# Patient Record
Sex: Male | Born: 2000
Health system: Southern US, Community
[De-identification: ages and names within clinical notes are randomized; demographics above are authoritative.]

## PROBLEM LIST (undated history)

## (undated) DIAGNOSIS — G43909 Migraine, unspecified, not intractable, without status migrainosus: Secondary | ICD-10-CM

## (undated) DIAGNOSIS — J45909 Unspecified asthma, uncomplicated: Secondary | ICD-10-CM

## (undated) DIAGNOSIS — F32A Depression, unspecified: Secondary | ICD-10-CM

## (undated) DIAGNOSIS — F329 Major depressive disorder, single episode, unspecified: Secondary | ICD-10-CM

## (undated) DIAGNOSIS — R569 Unspecified convulsions: Secondary | ICD-10-CM

## (undated) DIAGNOSIS — L709 Acne, unspecified: Secondary | ICD-10-CM

## (undated) DIAGNOSIS — T7840XA Allergy, unspecified, initial encounter: Secondary | ICD-10-CM

## (undated) HISTORY — PX: TYMPANOSTOMY TUBE PLACEMENT: SHX32

## (undated) HISTORY — DX: Unspecified asthma, uncomplicated: J45.909

## (undated) HISTORY — DX: Acne, unspecified: L70.9

## (undated) HISTORY — PX: WISDOM TOOTH EXTRACTION: SHX21

## (undated) HISTORY — PX: TONSILLECTOMY: SUR1361

---
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
## 2020-01-15 ENCOUNTER — Ambulatory Visit (HOSPITAL_COMMUNITY)
Admission: EM | Admit: 2020-01-15 | Discharge: 2020-01-15 | Disposition: A | Discharge status: Home / Self Care | Payer: 59 | Source: Home / Self Care

## 2020-01-15 ENCOUNTER — Encounter (HOSPITAL_COMMUNITY): Payer: Self-pay

## 2020-01-15 ENCOUNTER — Other Ambulatory Visit: Payer: Self-pay

## 2020-01-15 DIAGNOSIS — F1920 Other psychoactive substance dependence, uncomplicated: Secondary | ICD-10-CM

## 2020-01-15 DIAGNOSIS — F39 Unspecified mood [affective] disorder: Secondary | ICD-10-CM | POA: Diagnosis not present

## 2020-01-15 MED ORDER — GABAPENTIN 100 MG PO CAPS: 100.0000 mg | ORAL_CAPSULE | Freq: Three times a day (TID) | ORAL | 0 refills | Status: AC

## 2020-01-15 MED ORDER — GABAPENTIN 100 MG PO CAPS
100.0000 mg | ORAL_CAPSULE | Freq: Once | ORAL | Status: AC
  Administered 2020-01-15: 100 mg via ORAL
  Filled 2020-01-15: qty 1

## 2020-01-15 MED ORDER — QUETIAPINE FUMARATE 50 MG PO TABS
50.0000 mg | ORAL_TABLET | Freq: Once | ORAL | Status: AC
  Administered 2020-01-15: 50 mg via ORAL
  Filled 2020-01-15: qty 1

## 2020-01-15 MED ORDER — QUETIAPINE FUMARATE 50 MG PO TABS: 50.0000 mg | ORAL_TABLET | Freq: Every day | ORAL | 0 refills | Status: AC

## 2020-01-15 NOTE — ED Triage Notes (Signed)
Patient arrives as walk in with mom. Patient reports metal health has been progressively worse, 'feels like I'm tripping acid even though I'm not, I shouldn't feel like this sober'. Patient denies SI but states 'it could go that way soon if I don't get help'. Denies HI, unable to clearly state whether he's experiencing AVH, states 'I've had a psychotic break before and I feel like I'm going to have another'. Patient alert & oriented, calm & cooperative, in no acute distress.
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
## 2020-01-15 NOTE — ED Provider Notes (Signed)
[facility] Medical Screening Exam  Patient Name: [name] MRN: [id number] Date of Evaluation: 01/15/20 Chief Complaint: Chief Complaint/Presenting Problem: Per RN note: "Patient arrives as walk in with mom. Patient reports metal health has been progressively worse, 'feels like I'm tripping acid even though I'm not, I shouldn't feel like this sober'. Patient denies SI but states 'it could go that way soon if I don't get help'. Denies HI, unable to clearly state whether he's experiencing AVH, states 'I've had a psychotic break before and I feel like I'm going to have another'. Patient alert & oriented, calm & cooperative, in no acute distress." Diagnosis:  Final diagnoses:  Unspecified mood (affective) disorder (HCC)  Substance dependence, continuous (HCC)    History of Present illness: [name] is a 19 y.o. male with a history of polysubstance abuse (opiates, benzodiazepines, stimulants, kratom, marijuana, hallucinogens), MDD, and Bipolar Disorder. Patient reports that he stopped using kratom a week ago and since that time he has been feeling "manic and different." He states that how he is feeling is hard to describe. He states "it doesn't feel like the withdrawal that I had from opiates, so I feel like it might be something else." He states that he slowly weaned himself from the kratom and did not stop abruptly. He states that he has a history of Bipolar Disorder. States that in 2016 he took some bad drugs and "had my first psychotic episode." States that during that episode he experienced auditory, visual, and tactile hallucinations "of someone bashing and kicking my head over and over." He states that since that experience "I have not been right." He denies auditory and visual hallucinations outside of his experience in 2016. Patient reports that he continues to use marijuana daily. Denies use of opiates (other than kratom), benzodiazepines, stimulants, and alcohol. He  states that he has not slept in 3 days. Reports periods about once a year where he does not sleep for several days at a time. Reports episodes of depression that occur around this time of year. Patient reports that he currently feels depressed and "manic." He reports tearfulness, sadness, anxiety, and isolating himself. He denies hopelessness and worthlessness. He denies suicidal ideations. He denies homicidal ideations. He is able to verbally contract for safety if discharged home.  On evaluation patient is alert and oriented x 4. He is vaguely irritable with questions, but overall pleasant and respectful. Speech is clear and coherent, decreased in volume. Mood is depressed/anxious and affect is congruent with mood. Thought process is coherent and thought content is logical. Denies auditory and visual hallucinations. No indication that patient is responding to internal stimuli. No evidence of delusional thought content. Denies suicidal ideations. Denies homicidal ideations.   He states "the only medications that have helped me feel like myself, I can't have because of my addiction issues," referring to benzodiazepines. Chart review indicates that he has taken Seroquel in the past. He states that he cannot remember if it was helpful or not. Per PDMP, patient last filled Dextroamp-Amphet ER 20 Mg # 90 (90 day supply) on 1022/2021 and last filled clonazepam 1 mg #540 (90 day supply) on 06/04/2019.   Patient has an appointment with his psychiatrist, Dr. Milagros Evener, the end of January. States that he has not been able to get an earlier appointment.   TTS Collateral: Pt consented for clinician to speak to his mother to obtain additional information. Per mother, the pt is declining in activities for daily living,  he is sleeping all day and up all night. [name] mother reported, the pt is able to verbalize how he's feeling he woke up up at 0100 and said he needed help and not getting any better. Per mother, the pt  seen his PCP last Friday and was started on Prazosin for sleep. [name] mother reported, they tried reaching out to Dr. Evelene Croon but her earliest available appointment is February 29, 2020. [name] mother reported, when the pt is anxious he uses street drugs and has overdosed. [name] mother, reported, the pt accidentally overdosed twice last year, pt most recent overdose was October 2020. [name] mother reported, feels the pt will be safe if discharged from [facility]. With additional consent from the pt clinician discussed the discharge plan mother is agreeable and will follow up with [facility] if needed.  Psychiatric Specialty Exam  Presentation  General Appearance:Appropriate for Environment; Well Groomed  Eye Contact:Fair  Speech:Clear and Coherent; Normal Rate  Speech Volume:Decreased  Handedness:Right   Mood and Affect  Mood:Anxious; Depressed  Affect:Congruent   Thought Process  Thought Processes:Coherent; Linear; Goal Directed  Descriptions of Associations:Intact  Orientation:Full (Time, Place and Person)  Thought Content:Logical  Hallucinations:None  Ideas of Reference:None  Suicidal Thoughts:No  Homicidal Thoughts:No   Sensorium  Memory:Immediate Good; Recent Good; Remote Good  Judgment:Intact  Insight:Fair   Executive Functions  Concentration:Fair  Attention Span:Fair  Recall:Good  Fund of Knowledge:Good  Language:Good   Psychomotor Activity  Psychomotor Activity:Normal   Assets  Assets:Communication Skills; Desire for Improvement; Physical Health; Social Support; Talents/Skills; Transportation   Sleep  Sleep:Poor  Number of hours: No data recorded  Physical Exam: Physical Exam Constitutional:      General: He is not in acute distress.    Appearance: He is not ill-appearing, toxic-appearing or diaphoretic.  HENT:     Head: Normocephalic.     Right Ear: External ear normal.     Left Ear: External ear normal.  Eyes:     Pupils: Pupils are  equal, round, and reactive to light.  Cardiovascular:     Rate and Rhythm: Normal rate.  Pulmonary:     Effort: Pulmonary effort is normal. No respiratory distress.  Musculoskeletal:        General: Normal range of motion.  Skin:    General: Skin is warm and dry.  Neurological:     Mental Status: He is alert and oriented to person, place, and time.  Psychiatric:        Mood and Affect: Mood is anxious and depressed.        Speech: Speech normal.        Behavior: Behavior is cooperative.        Thought Content: Thought content is not paranoid or delusional. Thought content does not include homicidal or suicidal ideation. Thought content does not include suicidal plan.    Review of Systems  Constitutional: Negative for chills, diaphoresis, fever, malaise/fatigue and weight loss.  HENT: Negative for congestion.   Respiratory: Negative for cough and shortness of breath.   Cardiovascular: Negative for chest pain and palpitations.  Gastrointestinal: Negative for diarrhea, nausea and vomiting.  Neurological: Negative for dizziness and seizures.  Psychiatric/Behavioral: Positive for depression and substance abuse. Negative for hallucinations, memory loss and suicidal ideas. The patient is nervous/anxious and has insomnia.   All other systems reviewed and are negative.  Blood pressure 121/88, pulse 87, temperature 98.5 F (36.9 C), temperature source Temporal, resp. rate 16, height 6\' 2"  (1.88 m), weight 165 lb (  74.8 kg), SpO2 97 %. Body mass index is 21.18 kg/m.  Musculoskeletal: Strength & Muscle Tone: within normal limits Gait & Station: normal Patient leans: N/A  Demographic Factors:  Male, Adolescent or young adult and Caucasian  Loss Factors: Loss of significant relationship and friend passed away in 08-15-2022  Historical Factors: Family history of mental illness or substance abuse  Risk Reduction Factors:   Sense of responsibility to family, Religious beliefs about death,  Living with another person, especially a relative and Positive social support  Continued Clinical Symptoms:  Anxiety, depression, substance abuse  Cognitive Features That Contribute To Risk:  None    Suicide Risk:  Mild:  No identifiable sudicdal ideations.  Mild dysphoria and related symptoms, good self-control (both objective and subjective assessment), few other risk factors, and identifiable protective factors, including available and accessible social support.   [facility] Discharge Disposition for Follow up and Recommendations: Based on my evaluation the patient does not appear to have an emergency medical condition and can be discharged with resources and follow up care in outpatient services for Medication Management and Individual Therapy   Discussed overnight observation. Patient stated that he would prefer to go home.   Disposition: No evidence of imminent risk to self or others at present.   Patient does not meet criteria for psychiatric inpatient admission. Supportive therapy provided about ongoing stressors. Discussed crisis plan, support from social network, calling 911, coming to the Emergency Department, and calling Suicide Hotline. Discussed resources available at [facility] and open access hours.    Discussed restarting Seroquel and starting gabapentin. Discussed risks/benefits and side effects of Seroquel and gabapentin.   Start gabapentin 100 mg TID for anxiety Start Seroquel 50 mg QHS for sleep/mood stability  Patient was given Seroquel 50 mg x 1 dose and gabapentin 100 mg x 1 dose prior to discharge. Patient's mother is present at [facility] and will be driving him home.    Jackelyn Poling, NP 01/15/2020, 4:21 AM

## 2020-01-15 NOTE — Discharge Instructions (Addendum)

## 2020-01-15 NOTE — ED Notes (Signed)
Patient given medication, AVS reviewed, all questions answered & patient escorted to lobby where mom was waiting. Patient verbalized understanding of discharge instructions  Using teach back and is in no acute distress at time of discharge.

## 2020-01-20 ENCOUNTER — Telehealth (HOSPITAL_COMMUNITY): Payer: Self-pay | Admitting: Family Medicine

## 2020-01-20 NOTE — Telephone Encounter (Signed)
Care Management - Follow Up [facility] Discharges   Writer made contact with the patient.  Patient reports that he has a follow up appointment with Dr. Evelene Croon on February 29, 2020.

## 2020-02-12 ENCOUNTER — Other Ambulatory Visit: Payer: Self-pay | Admitting: Psychiatry

## 2020-02-12 NOTE — ED Notes (Signed)
Mom called inquiring about possibly getting refill of meds to last until OV on 02-29-2020 so he doesn't run out. Forwarded info to provider.

## 2021-10-23 ENCOUNTER — Encounter: Payer: Self-pay | Admitting: *Deleted

## 2022-01-11 ENCOUNTER — Encounter: Payer: Self-pay | Admitting: *Deleted

## 2022-10-29 DIAGNOSIS — F3189 Other bipolar disorder: Secondary | ICD-10-CM | POA: Diagnosis not present

## 2022-10-29 DIAGNOSIS — F4010 Social phobia, unspecified: Secondary | ICD-10-CM | POA: Diagnosis not present

## 2022-10-29 DIAGNOSIS — F1123 Opioid dependence with withdrawal: Secondary | ICD-10-CM | POA: Diagnosis not present

## 2022-11-19 DIAGNOSIS — F1123 Opioid dependence with withdrawal: Secondary | ICD-10-CM | POA: Diagnosis not present

## 2022-11-19 DIAGNOSIS — F4010 Social phobia, unspecified: Secondary | ICD-10-CM | POA: Diagnosis not present

## 2022-11-19 DIAGNOSIS — F3189 Other bipolar disorder: Secondary | ICD-10-CM | POA: Diagnosis not present

## 2022-11-22 ENCOUNTER — Other Ambulatory Visit (HOSPITAL_BASED_OUTPATIENT_CLINIC_OR_DEPARTMENT_OTHER): Payer: Self-pay

## 2022-11-22 ENCOUNTER — Other Ambulatory Visit: Payer: Self-pay

## 2022-11-22 MED ORDER — ONDANSETRON 8 MG PO TBDP
8.0000 mg | ORAL_TABLET | Freq: Three times a day (TID) | ORAL | 0 refills | Status: AC
  Filled 2022-11-22: qty 90, 30d supply, fill #0

## 2022-11-22 MED ORDER — CLONIDINE HCL 0.1 MG PO TABS
0.1000 mg | ORAL_TABLET | Freq: Three times a day (TID) | ORAL | 3 refills | Status: DC
  Filled 2022-11-22: qty 90, 30d supply, fill #0
  Filled 2022-12-19: qty 90, 30d supply, fill #1
  Filled 2023-01-15: qty 90, 30d supply, fill #2
  Filled 2023-04-22 (×2): qty 90, 30d supply, fill #3

## 2022-11-23 ENCOUNTER — Other Ambulatory Visit (HOSPITAL_BASED_OUTPATIENT_CLINIC_OR_DEPARTMENT_OTHER): Payer: Self-pay

## 2022-11-23 MED ORDER — GABAPENTIN 400 MG PO CAPS
400.0000 mg | ORAL_CAPSULE | Freq: Every day | ORAL | 11 refills | Status: DC
  Filled 2022-11-29: qty 30, 30d supply, fill #0
  Filled 2022-12-26: qty 30, 30d supply, fill #1
  Filled 2023-01-15 – 2023-01-19 (×4): qty 30, 30d supply, fill #2

## 2022-11-29 ENCOUNTER — Other Ambulatory Visit (HOSPITAL_BASED_OUTPATIENT_CLINIC_OR_DEPARTMENT_OTHER): Payer: Self-pay

## 2022-12-19 ENCOUNTER — Other Ambulatory Visit (HOSPITAL_BASED_OUTPATIENT_CLINIC_OR_DEPARTMENT_OTHER): Payer: Self-pay

## 2022-12-20 ENCOUNTER — Other Ambulatory Visit (HOSPITAL_BASED_OUTPATIENT_CLINIC_OR_DEPARTMENT_OTHER): Payer: Self-pay

## 2022-12-20 MED ORDER — CLONAZEPAM 1 MG PO TABS
1.0000 mg | ORAL_TABLET | Freq: Every day | ORAL | 0 refills | Status: AC | PRN
  Filled 2022-12-20: qty 30, 30d supply, fill #0

## 2022-12-26 ENCOUNTER — Other Ambulatory Visit (HOSPITAL_BASED_OUTPATIENT_CLINIC_OR_DEPARTMENT_OTHER): Payer: Self-pay

## 2023-01-11 DIAGNOSIS — F4010 Social phobia, unspecified: Secondary | ICD-10-CM | POA: Diagnosis not present

## 2023-01-11 DIAGNOSIS — F3189 Other bipolar disorder: Secondary | ICD-10-CM | POA: Diagnosis not present

## 2023-01-11 DIAGNOSIS — F1123 Opioid dependence with withdrawal: Secondary | ICD-10-CM | POA: Diagnosis not present

## 2023-01-15 ENCOUNTER — Other Ambulatory Visit (HOSPITAL_BASED_OUTPATIENT_CLINIC_OR_DEPARTMENT_OTHER): Payer: Self-pay

## 2023-01-15 ENCOUNTER — Other Ambulatory Visit: Payer: Self-pay

## 2023-01-15 MED ORDER — CLONAZEPAM 1 MG PO TABS
1.0000 mg | ORAL_TABLET | Freq: Every day | ORAL | 0 refills | Status: DC | PRN
  Filled 2023-01-19 (×2): qty 30, 30d supply, fill #0

## 2023-01-16 ENCOUNTER — Other Ambulatory Visit (HOSPITAL_BASED_OUTPATIENT_CLINIC_OR_DEPARTMENT_OTHER): Payer: Self-pay

## 2023-01-19 ENCOUNTER — Other Ambulatory Visit (HOSPITAL_BASED_OUTPATIENT_CLINIC_OR_DEPARTMENT_OTHER): Payer: Self-pay

## 2023-02-05 ENCOUNTER — Other Ambulatory Visit (HOSPITAL_BASED_OUTPATIENT_CLINIC_OR_DEPARTMENT_OTHER): Payer: Self-pay

## 2023-02-08 ENCOUNTER — Other Ambulatory Visit (HOSPITAL_BASED_OUTPATIENT_CLINIC_OR_DEPARTMENT_OTHER): Payer: Self-pay

## 2023-02-08 MED ORDER — GABAPENTIN 400 MG PO CAPS
400.0000 mg | ORAL_CAPSULE | Freq: Two times a day (BID) | ORAL | 3 refills | Status: DC
  Filled 2023-02-08: qty 180, 90d supply, fill #0
  Filled 2023-04-22 – 2023-05-06 (×2): qty 180, 90d supply, fill #1
  Filled 2023-05-07: qty 176, 88d supply, fill #1
  Filled 2023-05-07: qty 180, 90d supply, fill #1
  Filled 2023-05-07: qty 4, 2d supply, fill #1
  Filled 2023-06-21: qty 180, 90d supply, fill #2

## 2023-02-19 ENCOUNTER — Other Ambulatory Visit (HOSPITAL_BASED_OUTPATIENT_CLINIC_OR_DEPARTMENT_OTHER): Payer: Self-pay

## 2023-02-19 MED ORDER — CLONAZEPAM 1 MG PO TABS
1.0000 mg | ORAL_TABLET | Freq: Every day | ORAL | 1 refills | Status: DC | PRN
  Filled 2023-02-19: qty 30, 30d supply, fill #0
  Filled 2023-03-22 (×2): qty 30, 30d supply, fill #1

## 2023-03-22 ENCOUNTER — Other Ambulatory Visit (HOSPITAL_BASED_OUTPATIENT_CLINIC_OR_DEPARTMENT_OTHER): Payer: Self-pay

## 2023-04-22 ENCOUNTER — Other Ambulatory Visit: Payer: Self-pay

## 2023-04-22 ENCOUNTER — Other Ambulatory Visit (HOSPITAL_BASED_OUTPATIENT_CLINIC_OR_DEPARTMENT_OTHER): Payer: Self-pay

## 2023-04-22 MED ORDER — CLONAZEPAM 1 MG PO TABS
1.0000 mg | ORAL_TABLET | Freq: Every day | ORAL | 1 refills | Status: DC | PRN
  Filled 2023-04-22: qty 30, 30d supply, fill #0
  Filled 2023-05-22 (×2): qty 30, 30d supply, fill #1

## 2023-04-23 ENCOUNTER — Other Ambulatory Visit (HOSPITAL_BASED_OUTPATIENT_CLINIC_OR_DEPARTMENT_OTHER): Payer: Self-pay

## 2023-05-06 ENCOUNTER — Other Ambulatory Visit (HOSPITAL_BASED_OUTPATIENT_CLINIC_OR_DEPARTMENT_OTHER): Payer: Self-pay

## 2023-05-07 ENCOUNTER — Other Ambulatory Visit (HOSPITAL_BASED_OUTPATIENT_CLINIC_OR_DEPARTMENT_OTHER): Payer: Self-pay

## 2023-05-22 ENCOUNTER — Other Ambulatory Visit: Payer: Self-pay

## 2023-05-22 ENCOUNTER — Other Ambulatory Visit (HOSPITAL_BASED_OUTPATIENT_CLINIC_OR_DEPARTMENT_OTHER): Payer: Self-pay

## 2023-06-19 ENCOUNTER — Other Ambulatory Visit (HOSPITAL_BASED_OUTPATIENT_CLINIC_OR_DEPARTMENT_OTHER): Payer: Self-pay

## 2023-06-19 MED ORDER — CLONAZEPAM 1 MG PO TABS
1.0000 mg | ORAL_TABLET | Freq: Every day | ORAL | 0 refills | Status: AC | PRN
  Filled 2023-06-21: qty 30, 30d supply, fill #0

## 2023-06-21 ENCOUNTER — Other Ambulatory Visit (HOSPITAL_BASED_OUTPATIENT_CLINIC_OR_DEPARTMENT_OTHER): Payer: Self-pay

## 2023-07-08 DIAGNOSIS — F4010 Social phobia, unspecified: Secondary | ICD-10-CM | POA: Diagnosis not present

## 2023-07-08 DIAGNOSIS — F1123 Opioid dependence with withdrawal: Secondary | ICD-10-CM | POA: Diagnosis not present

## 2023-07-08 DIAGNOSIS — F3189 Other bipolar disorder: Secondary | ICD-10-CM | POA: Diagnosis not present

## 2023-07-08 DIAGNOSIS — F900 Attention-deficit hyperactivity disorder, predominantly inattentive type: Secondary | ICD-10-CM | POA: Diagnosis not present

## 2023-07-09 ENCOUNTER — Other Ambulatory Visit (HOSPITAL_BASED_OUTPATIENT_CLINIC_OR_DEPARTMENT_OTHER): Payer: Self-pay

## 2023-07-09 MED ORDER — GABAPENTIN 400 MG PO CAPS
400.0000 mg | ORAL_CAPSULE | Freq: Four times a day (QID) | ORAL | 3 refills | Status: AC
  Filled 2023-07-09: qty 360, 90d supply, fill #0
  Filled 2023-09-09: qty 360, 90d supply, fill #1
  Filled 2023-09-24 (×2): qty 120, 30d supply, fill #1
  Filled 2023-09-24: qty 360, 90d supply, fill #1
  Filled 2023-10-31: qty 120, 30d supply, fill #2
  Filled 2023-12-03: qty 120, 30d supply, fill #3
  Filled 2024-01-06: qty 120, 30d supply, fill #4
  Filled 2024-02-05: qty 120, 30d supply, fill #0
  Filled 2024-02-05: qty 120, 30d supply, fill #5
  Filled 2024-02-22: qty 120, 30d supply, fill #0

## 2023-07-09 MED ORDER — CLONAZEPAM 1 MG PO TABS
1.0000 mg | ORAL_TABLET | Freq: Two times a day (BID) | ORAL | 2 refills | Status: DC | PRN
  Filled 2023-07-09: qty 60, 30d supply, fill #0
  Filled 2023-08-08: qty 60, 30d supply, fill #1
  Filled 2023-09-09: qty 60, 30d supply, fill #2

## 2023-07-09 MED ORDER — CLONIDINE HCL 0.1 MG PO TABS
0.1000 mg | ORAL_TABLET | Freq: Three times a day (TID) | ORAL | 3 refills | Status: DC
  Filled 2023-07-09: qty 270, 90d supply, fill #0
  Filled 2023-09-23: qty 270, 90d supply, fill #1
  Filled 2023-10-31: qty 270, 90d supply, fill #2

## 2023-07-09 MED ORDER — AMPHETAMINE-DEXTROAMPHETAMINE 5 MG PO TABS
5.0000 mg | ORAL_TABLET | Freq: Every morning | ORAL | 0 refills | Status: AC
  Filled 2023-07-09: qty 90, 90d supply, fill #0

## 2023-08-08 ENCOUNTER — Other Ambulatory Visit (HOSPITAL_BASED_OUTPATIENT_CLINIC_OR_DEPARTMENT_OTHER): Payer: Self-pay

## 2023-09-09 ENCOUNTER — Other Ambulatory Visit: Payer: Self-pay

## 2023-09-09 ENCOUNTER — Other Ambulatory Visit (HOSPITAL_BASED_OUTPATIENT_CLINIC_OR_DEPARTMENT_OTHER): Payer: Self-pay

## 2023-09-10 ENCOUNTER — Other Ambulatory Visit (HOSPITAL_BASED_OUTPATIENT_CLINIC_OR_DEPARTMENT_OTHER): Payer: Self-pay

## 2023-09-10 MED ORDER — CEPHALEXIN 500 MG PO CAPS
500.0000 mg | ORAL_CAPSULE | Freq: Three times a day (TID) | ORAL | 0 refills | Status: AC
  Filled 2023-09-10: qty 30, 10d supply, fill #0

## 2023-09-23 ENCOUNTER — Other Ambulatory Visit (HOSPITAL_BASED_OUTPATIENT_CLINIC_OR_DEPARTMENT_OTHER): Payer: Self-pay

## 2023-09-24 ENCOUNTER — Other Ambulatory Visit (HOSPITAL_COMMUNITY): Payer: Self-pay

## 2023-09-24 ENCOUNTER — Other Ambulatory Visit: Payer: Self-pay

## 2023-09-24 ENCOUNTER — Other Ambulatory Visit (HOSPITAL_BASED_OUTPATIENT_CLINIC_OR_DEPARTMENT_OTHER): Payer: Self-pay

## 2023-10-11 ENCOUNTER — Other Ambulatory Visit (HOSPITAL_BASED_OUTPATIENT_CLINIC_OR_DEPARTMENT_OTHER): Payer: Self-pay

## 2023-10-11 MED ORDER — AMPHETAMINE-DEXTROAMPHETAMINE 5 MG PO TABS
5.0000 mg | ORAL_TABLET | Freq: Every morning | ORAL | 0 refills | Status: DC
  Filled 2023-10-11: qty 90, 90d supply, fill #0

## 2023-10-11 MED ORDER — CLONAZEPAM 1 MG PO TABS
1.0000 mg | ORAL_TABLET | Freq: Two times a day (BID) | ORAL | 2 refills | Status: DC | PRN
  Filled 2023-10-11: qty 60, 30d supply, fill #0
  Filled 2023-11-10 – 2023-11-11 (×2): qty 60, 30d supply, fill #1
  Filled 2023-12-03 – 2023-12-09 (×2): qty 60, 30d supply, fill #2

## 2023-10-12 ENCOUNTER — Other Ambulatory Visit (HOSPITAL_BASED_OUTPATIENT_CLINIC_OR_DEPARTMENT_OTHER): Payer: Self-pay

## 2023-10-14 ENCOUNTER — Other Ambulatory Visit: Payer: Self-pay

## 2023-10-14 ENCOUNTER — Other Ambulatory Visit (HOSPITAL_BASED_OUTPATIENT_CLINIC_OR_DEPARTMENT_OTHER): Payer: Self-pay

## 2023-10-31 ENCOUNTER — Other Ambulatory Visit: Payer: Self-pay

## 2023-10-31 ENCOUNTER — Other Ambulatory Visit (HOSPITAL_BASED_OUTPATIENT_CLINIC_OR_DEPARTMENT_OTHER): Payer: Self-pay

## 2023-11-11 ENCOUNTER — Other Ambulatory Visit (HOSPITAL_BASED_OUTPATIENT_CLINIC_OR_DEPARTMENT_OTHER): Payer: Self-pay

## 2023-11-11 ENCOUNTER — Other Ambulatory Visit: Payer: Self-pay

## 2023-11-29 ENCOUNTER — Other Ambulatory Visit (HOSPITAL_BASED_OUTPATIENT_CLINIC_OR_DEPARTMENT_OTHER): Payer: Self-pay

## 2023-11-29 MED ORDER — CLONIDINE HCL 0.2 MG PO TABS
0.2000 mg | ORAL_TABLET | Freq: Three times a day (TID) | ORAL | 0 refills | Status: AC
  Filled 2023-11-29: qty 270, 90d supply, fill #0

## 2023-11-30 ENCOUNTER — Other Ambulatory Visit (HOSPITAL_BASED_OUTPATIENT_CLINIC_OR_DEPARTMENT_OTHER): Payer: Self-pay

## 2023-12-03 ENCOUNTER — Other Ambulatory Visit (HOSPITAL_BASED_OUTPATIENT_CLINIC_OR_DEPARTMENT_OTHER): Payer: Self-pay

## 2023-12-05 ENCOUNTER — Other Ambulatory Visit (HOSPITAL_BASED_OUTPATIENT_CLINIC_OR_DEPARTMENT_OTHER): Payer: Self-pay

## 2023-12-09 ENCOUNTER — Other Ambulatory Visit (HOSPITAL_BASED_OUTPATIENT_CLINIC_OR_DEPARTMENT_OTHER): Payer: Self-pay

## 2023-12-09 ENCOUNTER — Other Ambulatory Visit: Payer: Self-pay

## 2023-12-17 ENCOUNTER — Other Ambulatory Visit (HOSPITAL_BASED_OUTPATIENT_CLINIC_OR_DEPARTMENT_OTHER): Payer: Self-pay

## 2023-12-17 MED ORDER — BUPRENORPHINE HCL-NALOXONE HCL 8-2 MG SL SUBL
SUBLINGUAL_TABLET | SUBLINGUAL | 0 refills | Status: AC
  Filled 2023-12-17: qty 7, 7d supply, fill #0

## 2023-12-31 ENCOUNTER — Other Ambulatory Visit (HOSPITAL_BASED_OUTPATIENT_CLINIC_OR_DEPARTMENT_OTHER): Payer: Self-pay

## 2023-12-31 MED ORDER — BUPRENORPHINE HCL-NALOXONE HCL 8-2 MG SL FILM
1.0000 | ORAL_FILM | Freq: Two times a day (BID) | SUBLINGUAL | 0 refills | Status: AC
  Filled 2023-12-31: qty 60, 30d supply, fill #0

## 2023-12-31 MED ORDER — NALOXONE HCL 4 MG/0.1ML NA LIQD
NASAL | 0 refills | Status: AC
  Filled 2023-12-31: qty 2, 15d supply, fill #0

## 2024-01-06 ENCOUNTER — Other Ambulatory Visit (HOSPITAL_BASED_OUTPATIENT_CLINIC_OR_DEPARTMENT_OTHER): Payer: Self-pay

## 2024-01-07 ENCOUNTER — Other Ambulatory Visit (HOSPITAL_BASED_OUTPATIENT_CLINIC_OR_DEPARTMENT_OTHER): Payer: Self-pay

## 2024-01-07 DIAGNOSIS — F4010 Social phobia, unspecified: Secondary | ICD-10-CM | POA: Diagnosis not present

## 2024-01-07 DIAGNOSIS — F900 Attention-deficit hyperactivity disorder, predominantly inattentive type: Secondary | ICD-10-CM | POA: Diagnosis not present

## 2024-01-07 DIAGNOSIS — F3189 Other bipolar disorder: Secondary | ICD-10-CM | POA: Diagnosis not present

## 2024-01-07 DIAGNOSIS — F1123 Opioid dependence with withdrawal: Secondary | ICD-10-CM | POA: Diagnosis not present

## 2024-01-07 MED ORDER — CLONAZEPAM 1 MG PO TABS
1.0000 mg | ORAL_TABLET | Freq: Three times a day (TID) | ORAL | 0 refills | Status: AC | PRN
  Filled 2024-01-07: qty 75, 25d supply, fill #0

## 2024-01-07 MED ORDER — CLONAZEPAM 1 MG PO TABS
1.0000 mg | ORAL_TABLET | Freq: Two times a day (BID) | ORAL | 0 refills | Status: AC | PRN
  Filled 2024-02-10: qty 60, 30d supply, fill #0

## 2024-01-07 MED ORDER — AMPHETAMINE-DEXTROAMPHETAMINE 5 MG PO TABS
5.0000 mg | ORAL_TABLET | Freq: Every morning | ORAL | 0 refills | Status: AC
  Filled 2024-01-11 – 2024-02-05 (×3): qty 90, 90d supply, fill #0

## 2024-01-11 ENCOUNTER — Other Ambulatory Visit (HOSPITAL_BASED_OUTPATIENT_CLINIC_OR_DEPARTMENT_OTHER): Payer: Self-pay

## 2024-01-25 ENCOUNTER — Other Ambulatory Visit (HOSPITAL_BASED_OUTPATIENT_CLINIC_OR_DEPARTMENT_OTHER): Payer: Self-pay

## 2024-01-27 ENCOUNTER — Other Ambulatory Visit (HOSPITAL_BASED_OUTPATIENT_CLINIC_OR_DEPARTMENT_OTHER): Payer: Self-pay

## 2024-02-05 ENCOUNTER — Other Ambulatory Visit (HOSPITAL_COMMUNITY): Payer: Self-pay

## 2024-02-05 ENCOUNTER — Other Ambulatory Visit (HOSPITAL_BASED_OUTPATIENT_CLINIC_OR_DEPARTMENT_OTHER): Payer: Self-pay

## 2024-02-06 ENCOUNTER — Other Ambulatory Visit: Payer: Self-pay

## 2024-02-07 ENCOUNTER — Other Ambulatory Visit: Payer: Self-pay

## 2024-02-10 ENCOUNTER — Other Ambulatory Visit (HOSPITAL_COMMUNITY): Payer: Self-pay

## 2024-02-10 ENCOUNTER — Other Ambulatory Visit (HOSPITAL_BASED_OUTPATIENT_CLINIC_OR_DEPARTMENT_OTHER): Payer: Self-pay

## 2024-02-11 ENCOUNTER — Other Ambulatory Visit: Payer: Self-pay

## 2024-02-14 ENCOUNTER — Other Ambulatory Visit: Payer: Self-pay

## 2024-02-22 ENCOUNTER — Other Ambulatory Visit (HOSPITAL_BASED_OUTPATIENT_CLINIC_OR_DEPARTMENT_OTHER): Payer: Self-pay
